# Patient Record
Sex: Female | Born: 1995 | Race: Black or African American | Hispanic: No | Marital: Single | Smoking: Former smoker
Health system: Southern US, Community
[De-identification: ages and names within clinical notes are randomized; demographics above are authoritative.]

## PROBLEM LIST (undated history)

## (undated) DIAGNOSIS — D649 Anemia, unspecified: Secondary | ICD-10-CM

## (undated) DIAGNOSIS — J45909 Unspecified asthma, uncomplicated: Secondary | ICD-10-CM

## (undated) DIAGNOSIS — E119 Type 2 diabetes mellitus without complications: Secondary | ICD-10-CM

## (undated) HISTORY — PX: NO PAST SURGERIES: SHX2092

---
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
## 2017-01-11 ENCOUNTER — Encounter (HOSPITAL_COMMUNITY): Payer: Self-pay

## 2017-01-11 ENCOUNTER — Ambulatory Visit (HOSPITAL_COMMUNITY)
Admission: RE | Admit: 2017-01-11 | Discharge: 2017-01-11 | Disposition: A | Discharge status: Home / Self Care | Payer: Medicaid Other | Source: Ambulatory Visit | Attending: Unknown Physician Specialty | Admitting: Unknown Physician Specialty

## 2017-01-11 DIAGNOSIS — O24313 Unspecified pre-existing diabetes mellitus in pregnancy, third trimester: Secondary | ICD-10-CM

## 2017-01-11 DIAGNOSIS — O24013 Pre-existing diabetes mellitus, type 1, in pregnancy, third trimester: Secondary | ICD-10-CM | POA: Diagnosis not present

## 2017-01-11 DIAGNOSIS — Z3A34 34 weeks gestation of pregnancy: Secondary | ICD-10-CM | POA: Insufficient documentation

## 2017-01-11 DIAGNOSIS — Z362 Encounter for other antenatal screening follow-up: Secondary | ICD-10-CM | POA: Insufficient documentation

## 2017-01-11 NOTE — Addendum Note (Signed)
Encounter addended by: Emeline DarlingKasie E Eldo Umanzor on: 01/11/2017  4:24 PM<BR>    Actions taken: Imaging Exam ended

## 2017-01-27 ENCOUNTER — Encounter (HOSPITAL_COMMUNITY): Payer: Self-pay

## 2017-01-27 ENCOUNTER — Emergency Department (HOSPITAL_COMMUNITY)
Admission: EM | Admit: 2017-01-27 | Discharge: 2017-01-27 | Disposition: A | Discharge status: Home / Self Care | Payer: Medicaid Other | Attending: Emergency Medicine | Admitting: Emergency Medicine

## 2017-01-27 DIAGNOSIS — O26893 Other specified pregnancy related conditions, third trimester: Secondary | ICD-10-CM | POA: Insufficient documentation

## 2017-01-27 DIAGNOSIS — J45909 Unspecified asthma, uncomplicated: Secondary | ICD-10-CM | POA: Diagnosis not present

## 2017-01-27 DIAGNOSIS — E119 Type 2 diabetes mellitus without complications: Secondary | ICD-10-CM | POA: Diagnosis not present

## 2017-01-27 DIAGNOSIS — Z794 Long term (current) use of insulin: Secondary | ICD-10-CM | POA: Insufficient documentation

## 2017-01-27 DIAGNOSIS — Z3490 Encounter for supervision of normal pregnancy, unspecified, unspecified trimester: Secondary | ICD-10-CM

## 2017-01-27 DIAGNOSIS — R1084 Generalized abdominal pain: Secondary | ICD-10-CM | POA: Diagnosis not present

## 2017-01-27 DIAGNOSIS — Z87891 Personal history of nicotine dependence: Secondary | ICD-10-CM | POA: Insufficient documentation

## 2017-01-27 DIAGNOSIS — Z3A37 37 weeks gestation of pregnancy: Secondary | ICD-10-CM | POA: Insufficient documentation

## 2017-01-27 LAB — URINALYSIS, ROUTINE W REFLEX MICROSCOPIC
Bilirubin Urine: NEGATIVE
GLUCOSE, UA: NEGATIVE mg/dL
HGB URINE DIPSTICK: NEGATIVE
Ketones, ur: NEGATIVE mg/dL
LEUKOCYTES UA: NEGATIVE
Nitrite: NEGATIVE
PH: 7 (ref 5.0–8.0)
PROTEIN: NEGATIVE mg/dL
SPECIFIC GRAVITY, URINE: 1.009 (ref 1.005–1.030)

## 2017-01-27 LAB — CBG MONITORING, ED: Glucose-Capillary: 127 mg/dL — ABNORMAL HIGH (ref 65–99)

## 2017-01-27 NOTE — Progress Notes (Signed)
Received faxed FHT tracing, FHR baseline 130 with 15x15 accels, no decelerations. Pt having occasional contractions. Dr Alysia PennaErvin viewed tracing. OB cleared. ED RN informed pt may come off  Fetal monitor.

## 2017-01-27 NOTE — Discharge Instructions (Signed)
Follow-up with your obstetrician in the next 2-3 days, sooner if you experience additional problems.

## 2017-01-27 NOTE — Progress Notes (Signed)
Received call from [facility] for a G3P0 36.[redacted] weeks pregnant who came in complaining of lower abdomen cramping going around to her lower back. Pt states she feels baby moving, and denies any leaking or bleeding. Pt placed on fetal monitor. Tried several times to admit pt to OBIX bed to do remote monitoring, put a call into IT and OBIX to help resolve issue. ED RN informed of OBIX issues and told to run paper and fax me the FHR tracing to show MD.

## 2017-01-27 NOTE — ED Notes (Signed)
Pt talking and laughing, does not appear to be in any distress

## 2017-01-27 NOTE — ED Triage Notes (Addendum)
Reports of lower abdominal cramping that radiates to lower back x2 weeks. States "it feels like something is pushing out of my cooter for 2 weeks now". States she is unable to hold her urine and feels water trickles out. Also reports of swelling that started last night. Was [facility] times for same complaint and was told she was dehydrated. Patient is diabetic.  CBG 127. BP: 141/92

## 2017-01-27 NOTE — ED Provider Notes (Addendum)
[facility]-EMERGENCY DEPT Provider Note   CSN: [id number] Arrival date & time: 01/27/17  1319  By signing my name below, I, Cynda Acres, attest that this documentation has been prepared under the direction and in the presence of Geoffery Lyons, MD. Electronically Signed: Cynda Acres, Scribe. 01/27/17. 1:54 PM.  History   Chief Complaint Chief Complaint  Patient presents with  . Abdominal Cramping    HPI Comments: Brandi Burton is a 21 y.o. female with a hx of DM, who presents to the Emergency Department complaining of sudden-onset, constant lower abdominal cramping that began 2 weeks ago. Patient states it feels as if something is being pushed out of her vagina. Patient has associated radiation to the lower back. Patient describes her pain as pressured. Patient reports [facility], in which they told her that she is and isn't contracting. Patient is [redacted] weeks pregnant. Patient denies any fluid rush or vaginal bleeding.   The history is provided by the patient. No language interpreter was used.    Past Medical History:  Diagnosis Date  . Anemia   . Asthma   . Diabetes mellitus without complication [facility])     Patient Active Problem List   Diagnosis Date Noted  . 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 Supervision of normal first pregnancy 09/05/2015  . New onset type 2 diabetes mellitus (HCC) 04/12/2015  . Overweight 04/12/2015  . Ketonuria 04/12/2015  . Goiter 04/12/2015  . Pallor 04/12/2015  . Essential hypertension, benign 04/12/2015    Past Surgical History:  Procedure Laterality Date  . NO PAST SURGERIES      OB History    Gravida Para Term Preterm AB Living   3       2 0   SAB TAB Ectopic Multiple Live Births   2               Home Medications    Prior to Admission medications   Medication Sig Start Date End Date Taking? Authorizing Provider  ACCU-CHEK FASTCLIX LANCETS MISC Check sugar 3 x daily  04/10/15   David Stall, MD  insulin aspart (NOVOLOG) 100 UNIT/ML injection Inject into the skin 3 (three) times daily before meals.    Historical Provider, MD  insulin detemir (LEVEMIR) 100 UNIT/ML injection Inject 16 Units into the skin at bedtime.    Historical Provider, MD  metFORMIN (GLUCOPHAGE) 500 MG tablet Take 1 tablet (500 mg total) by mouth 2 (two) times daily with a meal. Patient not taking: Reported on 09/04/2015 04/10/15 04/09/16  David Stall, MD  Prenatal Vit w/Fe-Methylfol-FA (PNV PO) Take by mouth.    Historical Provider, MD    Family History No family history on file.  Social History Social History  Substance Use Topics  . Smoking status: Former Smoker    Packs/day: 0.25    Years: 3.00    Types: Cigarettes    Quit date: 12/06/2016  . Smokeless tobacco: Never Used  . Alcohol use No     Allergies   Patient has no known allergies.   Review of Systems Review of Systems  Genitourinary: Positive for vaginal discharge. Negative for vaginal bleeding.  All other systems reviewed and are negative.    Physical Exam Updated Vital Signs BP 141/92 (BP Location: Right Arm)   Pulse 84   Temp 98.4 F (36.9 C) (Oral)   Resp 17   Ht 5\' 3"  (1.6 m)   Wt 180 lb 2 oz (  81.7 kg)   LMP 05/17/2016   SpO2 100%   BMI 31.91 kg/m   Physical Exam  Constitutional: She is oriented to person, place, and time. She appears well-developed.  HENT:  Head: Normocephalic and atraumatic.  Mouth/Throat: Oropharynx is clear and moist.  Eyes: Conjunctivae and EOM are normal. Pupils are equal, round, and reactive to light.  Neck: Normal range of motion. Neck supple.  Cardiovascular: Normal rate and regular rhythm.   Pulmonary/Chest: Effort normal and breath sounds normal.  Abdominal: Soft. Bowel sounds are normal.  Fundal height consisted with stated gestational age.   Musculoskeletal: Normal range of motion.  Neurological: She is alert and oriented to person, place, and time.    Skin: Skin is warm and dry.  Psychiatric: She has a normal mood and affect.     ED Treatments / Results  DIAGNOSTIC STUDIES: Oxygen Saturation is 100% on RA, normal by my interpretation.    COORDINATION OF CARE: 1:53 PM Discussed treatment plan with pt at bedside and pt agreed to plan.  Labs (all labs ordered are listed, but only abnormal results are displayed) Labs Reviewed  CBG MONITORING, ED - Abnormal; Notable for the following:       Result Value   Glucose-Capillary 127 (*)    All other components within normal limits    EKG  EKG Interpretation None       Radiology No results found.  Procedures Procedures (including critical care time)  Medications Ordered in ED Medications - No data to display   Initial Impression / Assessment and Plan / ED Course  I have reviewed the triage vital signs and the nursing notes.  Pertinent labs & imaging results that were available during my care of the patient were reviewed by me and considered in my medical decision making (see chart for details).  Patient presents here with complaints of abdominal distention. She was [redacted] weeks pregnant and believe she may be in labor. [facility] in the past week with similar complaints and each time was told no labor was occurring.  She was monitored for nearly 2 hours. She had some random minor contractions, however no evidence for active labor or fetal distress.  Her blood pressure is normal, there is no edema, and urinalysis is clear with no protein. She will be discharged, to [facility].  Final Clinical Impressions(s) / ED Diagnoses   Final diagnoses:  None    New Prescriptions New Prescriptions   No medications on file   I personally performed the services described in this documentation, which was scribed in my presence. The recorded information has been reviewed and is accurate.        Geoffery Lyonsouglas Tecia Cinnamon, MD 01/27/17  1525    Geoffery Lyonsouglas Mosetta Ferdinand, MD 01/27/17 [phone]

## 2017-01-27 NOTE — ED Notes (Signed)
Per Christine Rapid Response RN at [facility]-tracing is good, baby looks good, no distress. Pt does not appear to be in labor. May d/c monitor.

## 2017-02-08 ENCOUNTER — Ambulatory Visit (HOSPITAL_COMMUNITY): Admission: RE | Admit: 2017-02-08 | Payer: Medicaid Other | Source: Ambulatory Visit

## 2017-04-28 ENCOUNTER — Emergency Department (HOSPITAL_COMMUNITY): Payer: Medicaid Other

## 2017-04-28 ENCOUNTER — Encounter (HOSPITAL_COMMUNITY): Payer: Self-pay | Admitting: *Deleted

## 2017-04-28 ENCOUNTER — Emergency Department (HOSPITAL_COMMUNITY)
Admission: EM | Admit: 2017-04-28 | Discharge: 2017-04-28 | Disposition: A | Discharge status: Home / Self Care | Payer: Medicaid Other | Attending: Emergency Medicine | Admitting: Emergency Medicine

## 2017-04-28 DIAGNOSIS — J45909 Unspecified asthma, uncomplicated: Secondary | ICD-10-CM | POA: Diagnosis not present

## 2017-04-28 DIAGNOSIS — I10 Essential (primary) hypertension: Secondary | ICD-10-CM | POA: Diagnosis not present

## 2017-04-28 DIAGNOSIS — E119 Type 2 diabetes mellitus without complications: Secondary | ICD-10-CM | POA: Insufficient documentation

## 2017-04-28 DIAGNOSIS — I951 Orthostatic hypotension: Secondary | ICD-10-CM | POA: Diagnosis not present

## 2017-04-28 DIAGNOSIS — R55 Syncope and collapse: Secondary | ICD-10-CM

## 2017-04-28 DIAGNOSIS — Z87891 Personal history of nicotine dependence: Secondary | ICD-10-CM | POA: Diagnosis not present

## 2017-04-28 DIAGNOSIS — Z79899 Other long term (current) drug therapy: Secondary | ICD-10-CM | POA: Insufficient documentation

## 2017-04-28 DIAGNOSIS — Z794 Long term (current) use of insulin: Secondary | ICD-10-CM | POA: Insufficient documentation

## 2017-04-28 LAB — I-STAT BETA HCG BLOOD, ED (MC, WL, AP ONLY)

## 2017-04-28 LAB — URINALYSIS, ROUTINE W REFLEX MICROSCOPIC
BILIRUBIN URINE: NEGATIVE
Glucose, UA: NEGATIVE mg/dL
HGB URINE DIPSTICK: NEGATIVE
KETONES UR: 20 mg/dL — AB
Nitrite: NEGATIVE
Protein, ur: 30 mg/dL — AB
SPECIFIC GRAVITY, URINE: 1.029 (ref 1.005–1.030)
pH: 5 (ref 5.0–8.0)

## 2017-04-28 LAB — BASIC METABOLIC PANEL
Anion gap: 8 (ref 5–15)
BUN: 14 mg/dL (ref 6–20)
CHLORIDE: 108 mmol/L (ref 101–111)
CO2: 23 mmol/L (ref 22–32)
CREATININE: 0.76 mg/dL (ref 0.44–1.00)
Calcium: 9.3 mg/dL (ref 8.9–10.3)
GFR calc non Af Amer: >60 mL/min (ref >60)
Glucose, Bld: 106 mg/dL — ABNORMAL HIGH (ref 65–99)
Potassium: 3.9 mmol/L (ref 3.5–5.1)
SODIUM: 139 mmol/L (ref 135–145)

## 2017-04-28 LAB — CBC
HCT: 35 % — ABNORMAL LOW (ref 36.0–46.0)
Hemoglobin: 10.8 g/dL — ABNORMAL LOW (ref 12.0–15.0)
MCH: 24.4 pg — AB (ref 26.0–34.0)
MCHC: 30.9 g/dL (ref 30.0–36.0)
MCV: 79 fL (ref 78.0–100.0)
Platelets: 399 10*3/uL (ref 150–400)
RBC: 4.43 MIL/uL (ref 3.87–5.11)
RDW: 17.1 % — ABNORMAL HIGH (ref 11.5–15.5)
WBC: 5.4 10*3/uL (ref 4.0–10.5)

## 2017-04-28 MED ORDER — SODIUM CHLORIDE 0.9 % IV BOLUS (SEPSIS)
2000.0000 mL | Freq: Once | INTRAVENOUS | Status: AC
  Administered 2017-04-28: 2000 mL via INTRAVENOUS

## 2017-04-28 MED ORDER — ACETAMINOPHEN 500 MG PO TABS
1000.0000 mg | ORAL_TABLET | Freq: Once | ORAL | Status: AC
  Administered 2017-04-28: 1000 mg via ORAL
  Filled 2017-04-28: qty 2

## 2017-04-28 NOTE — ED Triage Notes (Signed)
To ED via [facility] for eval of near syncope while at work. Pt states she woke this am with a HA but no other symptoms. Works in a [employer]. States she was in bathroom at work - doesn't remember having syncopal episode but woke with 15 ppl around her. Pt had baby in March. States she could be pregnant (requests to not have results given in front of her mother). While moving pt from EMS stretcher to triage chair pt became slightly altered, diaphoretic but continued walking with assist. No vomiting. When pt sat in chair she looked up at RN and Medic without knowledge of what just happened. Pupils equal. Denies injury.

## 2017-04-28 NOTE — ED Notes (Signed)
ED Provider at bedside. 

## 2017-04-28 NOTE — ED Provider Notes (Signed)
[facility]-EMERGENCY DEPT Provider Note   CSN: [id number] Arrival date & time: 04/28/17  1309  By signing my name below, I, Thelma Barge, attest that this documentation has been prepared under the direction and in the presence of Melene Plan, DO. Electronically Signed: Thelma Barge, Scribe. 04/28/17. 2:27 PM.  History   Chief Complaint Chief Complaint  Patient presents with  . Near Syncope  . Weakness   The history is provided by the patient. No language interpreter was used.  Near Syncope  This is a new problem. The current episode started 6 to 12 hours ago. Pertinent negatives include no abdominal pain. Nothing aggravates the symptoms. Nothing relieves the symptoms. She has tried nothing for the symptoms.   HPI Comments: Brandi Burton is a 21 y.o. female with a PMHx of anemia who presents to the Emergency Department complaining of a near syncopal episode that occurred earlier today. She has associated HA. She notes she had an episode of diarrhea yesterday with some blood, but this is because she has hemorrhoids. She states when she stands up she has "cotton" in her ears. She denies abdominal pain, vomiting, CP, and SOB.   Past Medical History:  Diagnosis Date  . Anemia   . Asthma   . Diabetes mellitus without complication [facility])     Patient Active Problem List   Diagnosis Date Noted  . 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 Supervision of normal first pregnancy 09/05/2015  . New onset type 2 diabetes mellitus (HCC) 04/12/2015  . Overweight 04/12/2015  . Ketonuria 04/12/2015  . Goiter 04/12/2015  . Pallor 04/12/2015  . Essential hypertension, benign 04/12/2015    Past Surgical History:  Procedure Laterality Date  . NO PAST SURGERIES      OB History    Gravida Para Term Preterm AB Living   3       2 0   SAB TAB Ectopic Multiple Live Births   2               Home Medications    Prior to Admission medications   Medication Sig Start Date  End Date Taking? Authorizing Provider  ACCU-CHEK FASTCLIX LANCETS MISC Check sugar 3 x daily 04/10/15   David Stall, MD  insulin aspart (NOVOLOG) 100 UNIT/ML injection Inject 10 Units into the skin 3 (three) times daily before meals.     [provider]  insulin detemir (LEVEMIR) 100 UNIT/ML injection Inject 16 Units into the skin at bedtime.    [provider]  Prenatal Vit w/Fe-Methylfol-FA (PNV PO) Take 1 tablet by mouth daily.     [provider]    Family History No family history on file.  Social History Social History  Substance Use Topics  . Smoking status: Former Smoker    Packs/day: 0.25    Years: 3.00    Types: Cigarettes    Quit date: 12/06/2016  . Smokeless tobacco: Never Used  . Alcohol use No     Allergies   Patient has no known allergies.   Review of Systems Review of Systems  Constitutional: Negative for chills and fever.  HENT: Negative for congestion and rhinorrhea.   Eyes: Negative for redness and visual disturbance.  Respiratory: Negative for wheezing.   Cardiovascular: Positive for near-syncope. Negative for palpitations.  Gastrointestinal: Positive for blood in stool (due to hemorrhoids) and diarrhea. Negative for abdominal pain and nausea.  Genitourinary: Negative for dysuria and urgency.  Musculoskeletal: Negative for arthralgias and myalgias.  Skin: Negative for pallor and wound.  Neurological: Positive for syncope (near syncope) and light-headedness.     Physical Exam Updated Vital Signs BP (!) 132/92   Pulse (!) 58   Temp 97.7 F (36.5 C) (Oral)   Resp 20   LMP 05/17/2016   SpO2 100%   Physical Exam  Constitutional: She is oriented to person, place, and time. She appears well-developed and well-nourished. No distress.  Clinically orthostatic Decreased sensation to face subjectively   HENT:  Head: Normocephalic and atraumatic.  Eyes: EOM are normal. Pupils are equal, round, and reactive to light.    Neck: Normal range of motion. Neck supple.  Cardiovascular: Normal rate and regular rhythm.  Exam reveals no gallop and no friction rub.   No murmur heard. Pulmonary/Chest: Effort normal. She has no wheezes. She has no rales.  Abdominal: Soft. She exhibits no distension. There is no tenderness.  Musculoskeletal: She exhibits no edema or tenderness.  Neurological: She is alert and oriented to person, place, and time.  Skin: Skin is warm and dry. She is not diaphoretic.  Psychiatric: She has a normal mood and affect. Her behavior is normal.  Nursing note and vitals reviewed.    ED Treatments / Results  DIAGNOSTIC STUDIES: Oxygen Saturation is 100% on RA, normal by my interpretation.    COORDINATION OF CARE: 2:21 PM Discussed treatment plan with pt at bedside and pt agreed to plan.  Labs (all labs ordered are listed, but only abnormal results are displayed) Labs Reviewed  BASIC METABOLIC PANEL - Abnormal; Notable for the following:       Result Value   Glucose, Bld 106 (*)    All other components within normal limits  CBC - Abnormal; Notable for the following:    Hemoglobin 10.8 (*)    HCT 35.0 (*)    MCH 24.4 (*)    RDW 17.1 (*)    All other components within normal limits  URINALYSIS, ROUTINE W REFLEX MICROSCOPIC - Abnormal; Notable for the following:    Ketones, ur 20 (*)    Protein, ur 30 (*)    Leukocytes, UA TRACE (*)    Bacteria, UA RARE (*)    Squamous Epithelial / LPF 0-5 (*)    All other components within normal limits  I-STAT BETA HCG BLOOD, ED ([facility], [facility], [facility] ONLY)    EKG  EKG Interpretation  Date/Time:  Thursday Apr 28 2017 13:26:37 EDT Ventricular Rate:  67 PR Interval:  154 QRS Duration: 80 QT Interval:  400 QTC Calculation: 422 R Axis:   93 Text Interpretation:  Normal sinus rhythm with sinus arrhythmia Rightward axis Borderline ECG no wpw, prolonged qt or brugada No old tracing to compare Confirmed by Calvert Charland MD, DANIEL [phone]) on 04/28/2017 1:47:41 PM        Radiology Ct Head Wo Contrast  Result Date: 04/28/2017 CLINICAL DATA:  Patient had three syncopal episodes earlier today.Feels as if ears are stopped up. No injury to head. EXAM: CT HEAD WITHOUT CONTRAST TECHNIQUE: Contiguous axial images were obtained from the base of the skull through the vertex without intravenous contrast. COMPARISON:  None. FINDINGS: Brain: No evidence of acute infarction, hemorrhage, hydrocephalus, extra-axial collection or mass lesion/mass effect. Vascular: No hyperdense vessel or unexpected calcification. Skull: Normal. Negative for fracture or focal lesion. Sinuses/Orbits: No acute finding. Other: None. IMPRESSION: Normal exam. Electronically Signed   By: Norva Pavlov M.D.   On: 04/28/2017 15:52    Procedures Procedures (including critical care time)  Medications Ordered in ED Medications  sodium chloride 0.9 % bolus 2,000 mL (0 mLs Intravenous Stopped 04/28/17 1614)  acetaminophen (TYLENOL) tablet 1,000 mg (1,000 mg Oral Given 04/28/17 1439)     Initial Impression / Assessment and Plan / ED Course  I have reviewed the triage vital signs and the nursing notes.  Pertinent labs & imaging results that were available during my care of the patient were reviewed by me and considered in my medical decision making (see chart for details).     21 yo F With a chief complaint of a syncopal event. Patient states that it was very warm where she works and she went to the bathroom and when she stood up from the toilet she felt really lightheaded. She walked in the hallway and then woke up on the ground with people around her. She had one episode where she felt like she needed to vomit but resolved. Currently feels back to baseline. On my exam the patient was clinically orthostatic and felt very lightheaded upon standing. She has been complaining of a severe headache. I suspect the patient likely has a heat injury. Will give 2 L of fluids and reassess. With patient with a  new severe headache will obtain a CT scan. Labs reassuring.  CT negative. Patient feeling much better after IV fluids. Tolerating by mouth in the ED. Was able to ambulate now without difficulty. Requesting discharge.  4:15 PM:  I have discussed the diagnosis/risks/treatment options with the patient and family and believe the pt to be eligible for discharge home to follow-up with PCP. We also discussed returning to the ED immediately if new or worsening sx occur. We discussed the sx which are most concerning (e.g., sudden worsening pain, fever, inability to tolerate by mouth) that necessitate immediate return. Medications administered to the patient during their visit and any new prescriptions provided to the patient are listed below.  Medications given during this visit Medications  sodium chloride 0.9 % bolus 2,000 mL (0 mLs Intravenous Stopped 04/28/17 1614)  acetaminophen (TYLENOL) tablet 1,000 mg (1,000 mg Oral Given 04/28/17 1439)     The patient appears reasonably screen and/or stabilized for discharge and I doubt any other medical condition or other [facility] requiring further screening, evaluation, or treatment in the ED at this time prior to discharge.    Final Clinical Impressions(s) / ED Diagnoses   Final diagnoses:  Syncope and collapse  Orthostatic hypotension    New Prescriptions New Prescriptions   No medications on file  I personally performed the services described in this documentation, which was scribed in my presence. The recorded information has been reviewed and is accurate.      Melene PlanFloyd, Skyra Crichlow, DO 04/28/17 1615

## 2017-04-28 NOTE — ED Notes (Signed)
Pt ambulatory in treatment room to the door, pt became dizzy and diaphoretic, Dr Adela LankFloyd present in the room.

## 2017-04-28 NOTE — ED Notes (Signed)
Patient transported to CT 

## 2017-04-28 NOTE — ED Notes (Signed)
Pt wheeled to the restroom with wheelchair to obtain sample

## 2017-04-28 NOTE — ED Notes (Signed)
Pt returned from CT °

## 2017-04-28 NOTE — ED Notes (Signed)
Pt states she feels much better after her 2 L of fluid. Pt states "I went to the bathroom and was walking around just fine!"

## 2017-04-28 NOTE — ED Notes (Signed)
Pt unable to void at this time. 

## 2017-10-10 ENCOUNTER — Encounter (HOSPITAL_COMMUNITY): Payer: Self-pay

## 2018-03-07 ENCOUNTER — Encounter (HOSPITAL_COMMUNITY): Payer: Self-pay | Admitting: Emergency Medicine

## 2018-03-07 ENCOUNTER — Emergency Department (HOSPITAL_COMMUNITY)
Admission: EM | Admit: 2018-03-07 | Discharge: 2018-03-07 | Disposition: A | Discharge status: Home / Self Care | Payer: Self-pay | Attending: Emergency Medicine | Admitting: Emergency Medicine

## 2018-03-07 ENCOUNTER — Other Ambulatory Visit: Payer: Self-pay

## 2018-03-07 DIAGNOSIS — I10 Essential (primary) hypertension: Secondary | ICD-10-CM | POA: Insufficient documentation

## 2018-03-07 DIAGNOSIS — Z794 Long term (current) use of insulin: Secondary | ICD-10-CM | POA: Insufficient documentation

## 2018-03-07 DIAGNOSIS — Z79899 Other long term (current) drug therapy: Secondary | ICD-10-CM | POA: Insufficient documentation

## 2018-03-07 DIAGNOSIS — Z87891 Personal history of nicotine dependence: Secondary | ICD-10-CM | POA: Insufficient documentation

## 2018-03-07 DIAGNOSIS — R200 Anesthesia of skin: Secondary | ICD-10-CM | POA: Insufficient documentation

## 2018-03-07 DIAGNOSIS — R51 Headache: Secondary | ICD-10-CM | POA: Insufficient documentation

## 2018-03-07 DIAGNOSIS — H539 Unspecified visual disturbance: Secondary | ICD-10-CM | POA: Insufficient documentation

## 2018-03-07 DIAGNOSIS — J45909 Unspecified asthma, uncomplicated: Secondary | ICD-10-CM | POA: Insufficient documentation

## 2018-03-07 DIAGNOSIS — E119 Type 2 diabetes mellitus without complications: Secondary | ICD-10-CM | POA: Insufficient documentation

## 2018-03-07 LAB — CBG MONITORING, ED: Glucose-Capillary: 128 mg/dL — ABNORMAL HIGH (ref 65–99)

## 2018-03-07 MED ORDER — ACETAMINOPHEN ER 650 MG PO TBCR: 650.0000 mg | EXTENDED_RELEASE_TABLET | Freq: Three times a day (TID) | ORAL | 0 refills | Status: AC

## 2018-03-07 NOTE — Discharge Instructions (Addendum)
Please see the neurologist for further evaluation of your intermittent symptoms.  Please return to the ER if you get sudden, severe headaches or start developing new one sided numbness, tingling, weakness or confusion, seizures, poor balance or poor vision.

## 2018-03-07 NOTE — ED Triage Notes (Signed)
Pt states she had a panic attack 3 months ago, since then her head goes numb every day and has headaches every day.

## 2018-03-07 NOTE — ED Provider Notes (Signed)
[facility] EMERGENCY DEPARTMENT Provider Note   CSN: [id number] Arrival date & time: 03/07/18  1828     History   Chief Complaint No chief complaint on file.   HPI Brandi Burton is a 22 y.o. female.  HPI  22 year old with history of diabetes comes in with chief complaint of numbness of her head.  For the past 3 months patient has been having numbness intermittently to her head diffusely.  When patient's symptoms last more than 15 minutes she also gets bilateral blurry vision.  Her symptoms resolved with head massage.  Patient symptoms started after she had a panic attack.  Since then she gets daily 1 or 2 episodes that are as described above.  Patient denies any head trauma, there is no family history of brain aneurysm/brain bleeding/MS/brain tumors.  Patient does not have a week of headaches and in general she denies any severe headaches.  Review of system is negative for any medical condition besides diabetes.  Patient occasionally smokes but denies any heavy alcohol use or substance abuse.  Patient also had a singular syncopal episode 2 months ago.   Past Medical History:  Diagnosis Date  . Anemia   . Asthma   . Diabetes mellitus without complication [facility])     Patient Active Problem List   Diagnosis Date Noted  . 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 Supervision of normal first pregnancy 09/05/2015  . New onset type 2 diabetes mellitus (HCC) 04/12/2015  . Overweight 04/12/2015  . Ketonuria 04/12/2015  . Goiter 04/12/2015  . Pallor 04/12/2015  . Essential hypertension, benign 04/12/2015    Past Surgical History:  Procedure Laterality Date  . NO PAST SURGERIES       OB History    Gravida  3   Para      Term      Preterm      AB  2   Living  0     SAB  2   TAB      Ectopic      Multiple      Live Births               Home Medications    Prior to Admission medications   Medication Sig Start Date End Date  Taking? Authorizing Provider  ACCU-CHEK FASTCLIX LANCETS MISC Check sugar 3 x daily 04/10/15   David Stall, MD  acetaminophen (TYLENOL 8 HOUR) 650 MG CR tablet Take 1 tablet (650 mg total) by mouth every 8 (eight) hours. 03/07/18   Derwood Kaplan, MD  insulin aspart (NOVOLOG) 100 UNIT/ML injection Inject 10 Units into the skin 3 (three) times daily before meals.     [provider]  insulin detemir (LEVEMIR) 100 UNIT/ML injection Inject 16 Units into the skin at bedtime.    [provider]  Prenatal Vit w/Fe-Methylfol-FA (PNV PO) Take 1 tablet by mouth daily.     [provider]    Family History No family history on file.  Social History Social History   Tobacco Use  . Smoking status: Former Smoker    Packs/day: 0.25    Years: 3.00    Pack years: 0.75    Types: Cigarettes    Last attempt to quit: 12/06/2016    Years since quitting: 1.2  . Smokeless tobacco: Never Used  Substance Use Topics  . Alcohol use: No    Alcohol/week: 0.0 oz  . Drug use: Yes    Types: Marijuana  Comment: 2 blunts a day, stopped 12-06-16     Allergies   Patient has no known allergies.   Review of Systems Review of Systems  Constitutional: Negative for activity change.  Eyes: Positive for visual disturbance.  Skin: Negative for rash.  Allergic/Immunologic: Negative for immunocompromised state.  Neurological: Positive for numbness and headaches. Negative for dizziness, seizures and facial asymmetry.  Hematological: Does not bruise/bleed easily.     Physical Exam Updated Vital Signs BP 128/83   Pulse 85   Temp 98.2 F (36.8 C) (Oral)   Resp 20   Ht 5\' 3"  (1.6 m)   Wt 63 kg (139 lb)   SpO2 100%   BMI 24.62 kg/m   Physical Exam  Constitutional: She is oriented to person, place, and time. She appears well-developed.  HENT:  Head: Normocephalic and atraumatic.  Eyes: EOM are normal.  Neck: Normal range of motion. Neck supple.  No bruits  Cardiovascular:  Normal rate.  Pulmonary/Chest: Effort normal.  Abdominal: Bowel sounds are normal.  Neurological: She is alert and oriented to person, place, and time.  Cerebellar exam is normal (finger to nose) Sensory exam normal for bilateral upper and lower extremities - and patient is able to discriminate between sharp and dull. Motor exam is 4+/5   Skin: Skin is warm and dry.  Nursing note and vitals reviewed.    ED Treatments / Results  Labs (all labs ordered are listed, but only abnormal results are displayed) Labs Reviewed  CBG MONITORING, ED - Abnormal; Notable for the following components:      Result Value   Glucose-Capillary 128 (*)    All other components within normal limits    EKG None  Radiology No results found.  Procedures Procedures (including critical care time)  Medications Ordered in ED Medications - No data to display   Initial Impression / Assessment and Plan / ED Course  I have reviewed the triage vital signs and the nursing notes.  Pertinent labs & imaging results that were available during my care of the patient were reviewed by me and considered in my medical decision making (see chart for details).     22 year old female comes in with chief complaint of numbness.  Patient is also complaining of intermittent headaches associated with the numbness and some vision disturbance.  Patient is diabetic.  Her symptoms started 3 months ago after a panic attack.  Episodes are intermittent and without any specific evoking factors.  Symptoms last for about 15 minutes.  I am unsure as to what the underlying etiologies.  Patient does not have any nuchal rigidity, neck bruits and her neuro exam is nonfocal.  We did consider emergent pathologies like brain tumor, pseudotumor cerebri, brain aneurysms in our differential diagnosis, however with bilateral vision changes and generalized numbness over the head with headaches -there is no clear pathway towards a diagnosis.  Since  her symptoms have been going on for 3 months I will defer further nonemergent workup to the neurologist.  Final Clinical Impressions(s) / ED Diagnoses   Final diagnoses:  Numbness  Visual disturbance    ED Discharge Orders        Ordered    acetaminophen (TYLENOL 8 HOUR) 650 MG CR tablet  Every 8 hours     03/07/18 1937       Derwood KaplanNanavati, Jin Capote, MD 03/07/18 1951

## 2018-04-21 ENCOUNTER — Encounter: Payer: Self-pay | Admitting: "Endocrinology

## 2018-06-01 ENCOUNTER — Ambulatory Visit: Payer: Self-pay | Admitting: "Endocrinology

## 2018-12-03 ENCOUNTER — Other Ambulatory Visit: Payer: Self-pay

## 2018-12-03 ENCOUNTER — Encounter (HOSPITAL_COMMUNITY): Payer: Self-pay | Admitting: Emergency Medicine

## 2018-12-03 ENCOUNTER — Emergency Department (HOSPITAL_COMMUNITY): Payer: Medicaid Other

## 2018-12-03 ENCOUNTER — Emergency Department (HOSPITAL_COMMUNITY)
Admission: EM | Admit: 2018-12-03 | Discharge: 2018-12-03 | Disposition: A | Discharge status: Home / Self Care | Payer: Medicaid Other | Attending: Emergency Medicine | Admitting: Emergency Medicine

## 2018-12-03 DIAGNOSIS — K5900 Constipation, unspecified: Secondary | ICD-10-CM

## 2018-12-03 DIAGNOSIS — Z794 Long term (current) use of insulin: Secondary | ICD-10-CM | POA: Insufficient documentation

## 2018-12-03 DIAGNOSIS — K2900 Acute gastritis without bleeding: Secondary | ICD-10-CM

## 2018-12-03 DIAGNOSIS — E119 Type 2 diabetes mellitus without complications: Secondary | ICD-10-CM | POA: Insufficient documentation

## 2018-12-03 DIAGNOSIS — Z87891 Personal history of nicotine dependence: Secondary | ICD-10-CM | POA: Insufficient documentation

## 2018-12-03 DIAGNOSIS — J45909 Unspecified asthma, uncomplicated: Secondary | ICD-10-CM | POA: Insufficient documentation

## 2018-12-03 DIAGNOSIS — I10 Essential (primary) hypertension: Secondary | ICD-10-CM | POA: Insufficient documentation

## 2018-12-03 DIAGNOSIS — R1011 Right upper quadrant pain: Secondary | ICD-10-CM

## 2018-12-03 LAB — CBC
HCT: 38.8 % (ref 36.0–46.0)
Hemoglobin: 12.4 g/dL (ref 12.0–15.0)
MCH: 28.8 pg (ref 26.0–34.0)
MCHC: 32 g/dL (ref 30.0–36.0)
MCV: 90 fL (ref 80.0–100.0)
PLATELETS: 398 10*3/uL (ref 150–400)
RBC: 4.31 MIL/uL (ref 3.87–5.11)
RDW: 11.7 % (ref 11.5–15.5)
WBC: 7.6 10*3/uL (ref 4.0–10.5)
nRBC: 0 % (ref 0.0–0.2)

## 2018-12-03 LAB — URINALYSIS, ROUTINE W REFLEX MICROSCOPIC
Bacteria, UA: NONE SEEN
Bilirubin Urine: NEGATIVE
Glucose, UA: >=500 mg/dL — AB
Hgb urine dipstick: NEGATIVE
KETONES UR: NEGATIVE mg/dL
Nitrite: NEGATIVE
PH: 6 (ref 5.0–8.0)
PROTEIN: NEGATIVE mg/dL
Specific Gravity, Urine: 1.022 (ref 1.005–1.030)

## 2018-12-03 LAB — COMPREHENSIVE METABOLIC PANEL
ALBUMIN: 4.3 g/dL (ref 3.5–5.0)
ALK PHOS: 71 U/L (ref 38–126)
ALT: 10 U/L (ref 0–44)
AST: 14 U/L — AB (ref 15–41)
Anion gap: 9 (ref 5–15)
BUN: 15 mg/dL (ref 6–20)
CALCIUM: 9.3 mg/dL (ref 8.9–10.3)
CO2: 26 mmol/L (ref 22–32)
CREATININE: 0.68 mg/dL (ref 0.44–1.00)
Chloride: 100 mmol/L (ref 98–111)
GFR calc Af Amer: >60 mL/min (ref >60)
GFR calc non Af Amer: >60 mL/min (ref >60)
GLUCOSE: 159 mg/dL — AB (ref 70–99)
Potassium: 4 mmol/L (ref 3.5–5.1)
Sodium: 135 mmol/L (ref 135–145)
Total Bilirubin: 0.5 mg/dL (ref 0.3–1.2)
Total Protein: 8.2 g/dL — ABNORMAL HIGH (ref 6.5–8.1)

## 2018-12-03 LAB — LIPASE, BLOOD: Lipase: 26 U/L (ref 11–51)

## 2018-12-03 LAB — HCG, QUANTITATIVE, PREGNANCY: hCG, Beta Chain, Quant, S: <1 m[IU]/mL (ref <5)

## 2018-12-03 MED ORDER — RANITIDINE HCL 150 MG PO TABS: 150.0000 mg | ORAL_TABLET | Freq: Two times a day (BID) | ORAL | 0 refills | Status: AC

## 2018-12-03 MED ORDER — POLYETHYLENE GLYCOL 3350 17 G PO PACK: 17.0000 g | PACK | Freq: Every day | ORAL | 0 refills | Status: AC

## 2018-12-03 MED ORDER — SODIUM CHLORIDE 0.9 % IV BOLUS
1000.0000 mL | Freq: Once | INTRAVENOUS | Status: AC
  Administered 2018-12-03: 1000 mL via INTRAVENOUS

## 2018-12-03 MED ORDER — IOPAMIDOL (ISOVUE-300) INJECTION 61%
100.0000 mL | Freq: Once | INTRAVENOUS | Status: AC | PRN
  Administered 2018-12-03: 100 mL via INTRAVENOUS

## 2018-12-03 NOTE — ED Provider Notes (Signed)
[facility] EMERGENCY DEPARTMENT Provider Note   CSN: [id number] Arrival date & time: 12/03/18  1830     History   Chief Complaint Chief Complaint  Patient presents with  . Abdominal Pain    HPI Brandi Burton is a 22 y.o. female.  She is complaining of some right flank and right upper quadrant pain that is been going on for about a week.  She said it has been happening every day.  She also feels a little bit of suprapubic pain.  She is never had this before.  No fevers or chills no nausea no vomiting.  She says the pain gets a little worse when she urinates.  No dysuria.  No surgical history.   Abdominal Pain   This is a new problem. The current episode started more than 1 week ago. The problem occurs constantly. The problem has not changed since onset.The pain is associated with an unknown factor. The pain is located in the RUQ. The quality of the pain is aching. The pain is moderate. Pertinent negatives include fever, diarrhea, nausea, vomiting, constipation, dysuria, frequency, hematuria and headaches. The symptoms are aggravated by urination. Nothing relieves the symptoms.    Past Medical History:  Diagnosis Date  . Anemia   . Asthma   . Diabetes mellitus without complication [facility])     Patient Active Problem List   Diagnosis Date Noted  . 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 Supervision of normal first pregnancy 09/05/2015  . New onset type 2 diabetes mellitus (HCC) 04/12/2015  . Overweight 04/12/2015  . Ketonuria 04/12/2015  . Goiter 04/12/2015  . Pallor 04/12/2015  . Essential hypertension, benign 04/12/2015    Past Surgical History:  Procedure Laterality Date  . NO PAST SURGERIES       OB History    Gravida  3   Para      Term      Preterm      AB  2   Living  0     SAB  2   TAB      Ectopic      Multiple      Live Births               Home Medications    Prior to Admission medications     Medication Sig Start Date End Date Taking? Authorizing Provider  ACCU-CHEK FASTCLIX LANCETS MISC Check sugar 3 x daily 04/10/15   David Stall, MD  acetaminophen (TYLENOL 8 HOUR) 650 MG CR tablet Take 1 tablet (650 mg total) by mouth every 8 (eight) hours. 03/07/18   Derwood Kaplan, MD  insulin aspart (NOVOLOG) 100 UNIT/ML injection Inject 10 Units into the skin 3 (three) times daily before meals.     [provider]  insulin detemir (LEVEMIR) 100 UNIT/ML injection Inject 16 Units into the skin at bedtime.    [provider]  Prenatal Vit w/Fe-Methylfol-FA (PNV PO) Take 1 tablet by mouth daily.     [provider]    Family History No family history on file.  Social History Social History   Tobacco Use  . Smoking status: Former Smoker    Packs/day: 0.25    Years: 3.00    Pack years: 0.75    Types: Cigarettes    Last attempt to quit: 12/06/2016    Years since quitting: 1.9  . Smokeless tobacco: Never Used  Substance Use Topics  . Alcohol use: No  Alcohol/week: 0.0 standard drinks  . Drug use: Yes    Types: Marijuana    Comment: 2 blunts a day, stopped 12-06-16     Allergies   Patient has no known allergies.   Review of Systems Review of Systems  Constitutional: Negative for fever.  HENT: Negative for sore throat.   Eyes: Negative for visual disturbance.  Respiratory: Negative for shortness of breath.   Cardiovascular: Negative for chest pain.  Gastrointestinal: Positive for abdominal pain. Negative for constipation, diarrhea, nausea and vomiting.  Genitourinary: Negative for dysuria, frequency and hematuria.  Musculoskeletal: Positive for back pain. Negative for neck pain.  Skin: Negative for rash.  Neurological: Negative for headaches.     Physical Exam Updated Vital Signs BP 126/82 (BP Location: Right Arm)   Pulse 85   Temp 98.4 F (36.9 C) (Oral)   Resp 16   Ht 5\' 3"  (1.6 m)   Wt 74.4 kg   LMP 11/29/2018   SpO2 100%   BMI  29.05 kg/m   Physical Exam Vitals signs and nursing note reviewed.  Constitutional:      General: She is not in acute distress.    Appearance: She is well-developed.  HENT:     Head: Normocephalic and atraumatic.  Eyes:     Conjunctiva/sclera: Conjunctivae normal.  Neck:     Musculoskeletal: Neck supple.  Cardiovascular:     Rate and Rhythm: Normal rate and regular rhythm.     Heart sounds: No murmur.  Pulmonary:     Effort: Pulmonary effort is normal. No respiratory distress.     Breath sounds: Normal breath sounds.  Abdominal:     Palpations: Abdomen is soft.     Tenderness: There is abdominal tenderness in the right upper quadrant. There is right CVA tenderness. There is no guarding or rebound.  Musculoskeletal: Normal range of motion.        General: No tenderness or signs of injury.  Skin:    General: Skin is warm and dry.     Capillary Refill: Capillary refill takes less than 2 seconds.  Neurological:     General: No focal deficit present.     Mental Status: She is alert and oriented to person, place, and time.      ED Treatments / Results  Labs (all labs ordered are listed, but only abnormal results are displayed) Labs Reviewed  COMPREHENSIVE METABOLIC PANEL - Abnormal; Notable for the following components:      Result Value   Glucose, Bld 159 (*)    Total Protein 8.2 (*)    AST 14 (*)    All other components within normal limits  URINALYSIS, ROUTINE W REFLEX MICROSCOPIC - Abnormal; Notable for the following components:   Glucose, UA >=500 (*)    Leukocytes, UA SMALL (*)    All other components within normal limits  LIPASE, BLOOD  CBC  HCG, QUANTITATIVE, PREGNANCY    EKG None  Radiology Ct Abdomen Pelvis W Contrast  Result Date: 12/03/2018 CLINICAL DATA:  Right upper quadrant pain radiating to the anterior and left upper quadrant of the abdomen. Nausea without vomiting. EXAM: CT ABDOMEN AND PELVIS WITH CONTRAST TECHNIQUE: Multidetector CT imaging of  the abdomen and pelvis was performed using the standard protocol following bolus administration of intravenous contrast. CONTRAST:  100mL ISOVUE-300 IOPAMIDOL (ISOVUE-300) INJECTION 61% COMPARISON:  None. FINDINGS: Lower chest: No acute abnormality. Hepatobiliary: No focal liver abnormality is seen. No gallstones, gallbladder wall thickening, or biliary dilatation. Pancreas: Unremarkable. No pancreatic  ductal dilatation or surrounding inflammatory changes. Spleen: Normal in size without focal abnormality. Adrenals/Urinary Tract: Adrenal glands are unremarkable. Kidneys are normal, without renal calculi, focal lesion, or hydronephrosis. Bladder is unremarkable. Stomach/Bowel: The stomach is decompressed in appearance accounting for its somewhat thickened appearance. Gastritis is believed less likely but not entirely excluded. Normal duodenal sweep and of Treitz. Moderate stool retention within the colon without acute obstruction or inflammation. Normal appendix. Vascular/Lymphatic: No significant vascular findings are present. No enlarged abdominal or pelvic lymph nodes. Reproductive: Uterus and bilateral adnexa are unremarkable. Other: No abdominal wall hernia or abnormality. No abdominopelvic ascites. Musculoskeletal: No acute or significant osseous findings. IMPRESSION: 1. Decompressed appearance of the stomach may account the somewhat thickened appearance. The possibility of gastritis is not entirely excluded given history of left upper quadrant pain. 2. Moderate stool retention within the colon. Electronically Signed   By: Tollie Ethavid  Kwon M.D.   On: 12/03/2018 23:15    Procedures Procedures (including critical care time)  Medications Ordered in ED Medications  sodium chloride 0.9 % bolus 1,000 mL (has no administration in time range)     Initial Impression / Assessment and Plan / ED Course  I have reviewed the triage vital signs and the nursing notes.  Pertinent labs & imaging results that were  available during my care of the patient were reviewed by me and considered in my medical decision making (see chart for details).  Clinical Course as of Dec 05 1323  Sun Dec 03, 2018  2320 I updated the patient on the results of her test.  She said she has not pooped in a couple weeks because she had a pack cheese.  I will start her on a laxative and also start her on some acid medication in case the gastritis is the cause of her symptoms.   [MB]    Clinical Course User Index [MB] Terrilee FilesButler, Yanelle Sousa C, MD     Final Clinical Impressions(s) / ED Diagnoses   Final diagnoses:  Right upper quadrant abdominal pain  Constipation, unspecified constipation type  Acute gastritis without hemorrhage, unspecified gastritis type    ED Discharge Orders         Ordered    polyethylene glycol (MIRALAX / GLYCOLAX) packet  Daily     12/03/18 2308    ranitidine (ZANTAC) 150 MG tablet  2 times daily     12/03/18 2321           Terrilee FilesButler, Genoa Freyre C, MD 12/04/18 1325

## 2018-12-03 NOTE — ED Notes (Signed)
Patient transported to CT 

## 2018-12-03 NOTE — Discharge Instructions (Addendum)
You were seen in the emergency department for 1 week of right upper quadrant pain.  Your lab work and urinalysis were unremarkable.  Your CAT scan did not show an obvious cause of your pain other than you look like you have a lot of constipation.  You should drink plenty of fluids and we are prescribing you some medication as a laxative.  Please return if any worsening symptoms.

## 2018-12-03 NOTE — ED Triage Notes (Signed)
PT STATES THAT SHE HAS BEEN HAVING RIGHT SIDED ABD PAIN THAT NOW GOES ALL THE WAY ACROSS HER ABD. SHE DENIES N/V/D

## 2019-01-28 IMAGING — CT CT HEAD W/O CM
4 series · 16 of 47 positions shown, 18 images · non-contrast
Comparison: None.

CLINICAL DATA: Patient had three syncopal episodes earlier
today.Feels as if ears are stopped up. No injury to head.

EXAM:
CT HEAD WITHOUT CONTRAST
TECHNIQUE: Contiguous axial images were obtained from the base of the skull
through the vertex without intravenous contrast.

[Series 3: head without · axial · non-contrast · 0.44mm/px · z∈[+1058,+1183]mm · 7 of 35 slices shown, 9 images]
[im 5/35  brain]
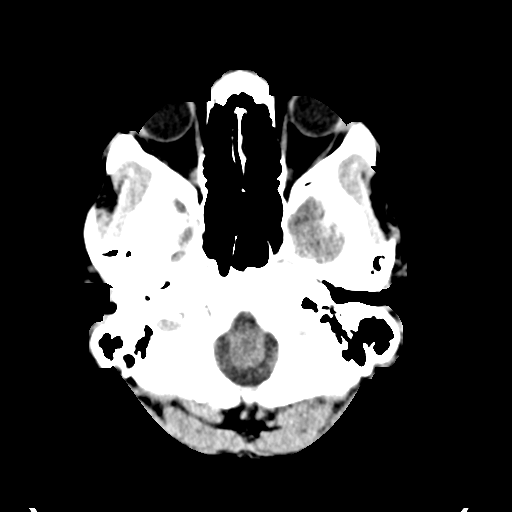
[im 5/35  bone]
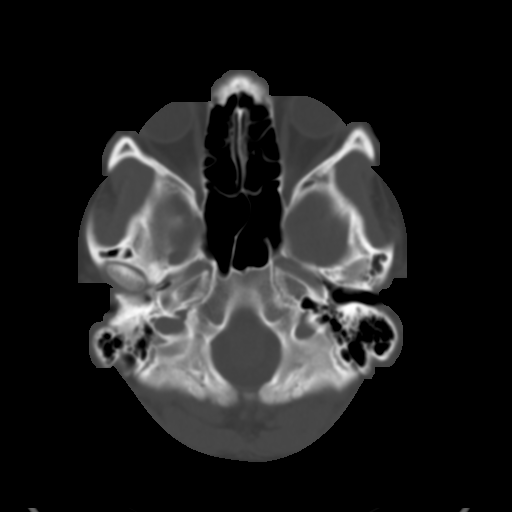
[im 9/35  brain]
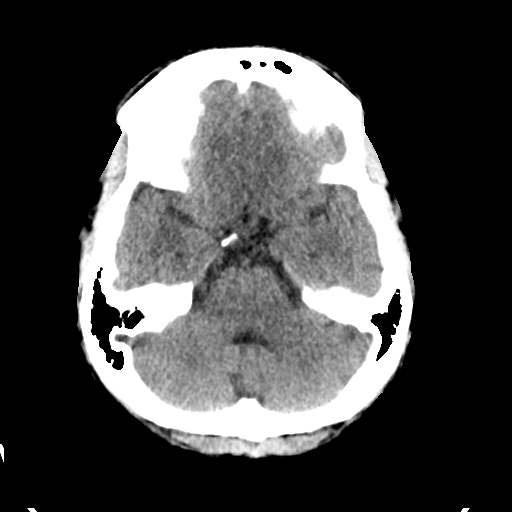
[im 13/35  brain]
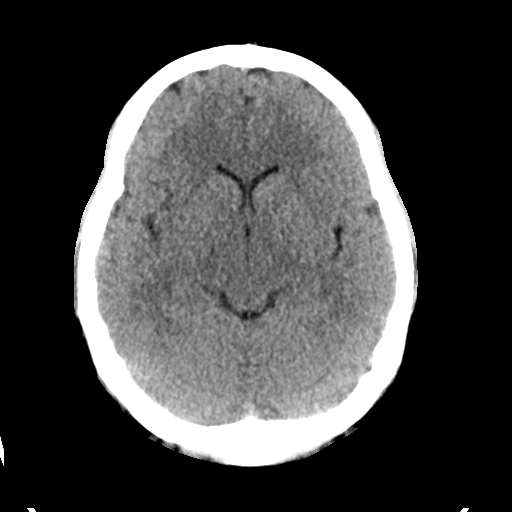
[im 18/35  brain]
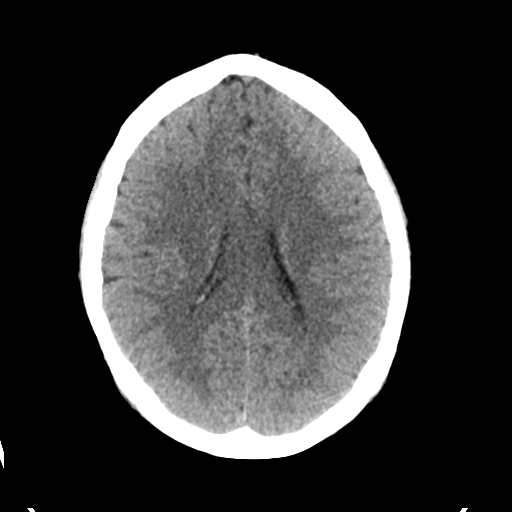
[im 22/35  brain]
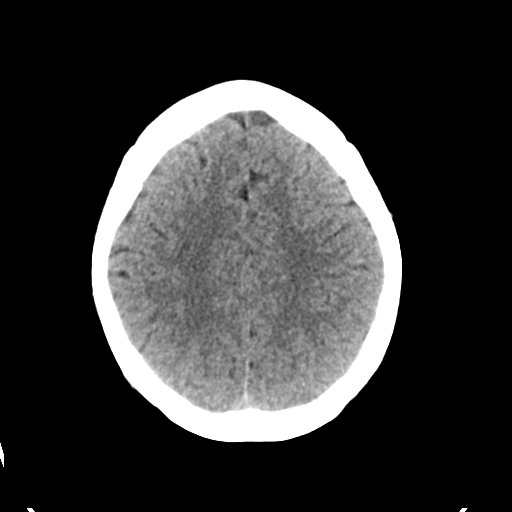
[im 22/35  bone]
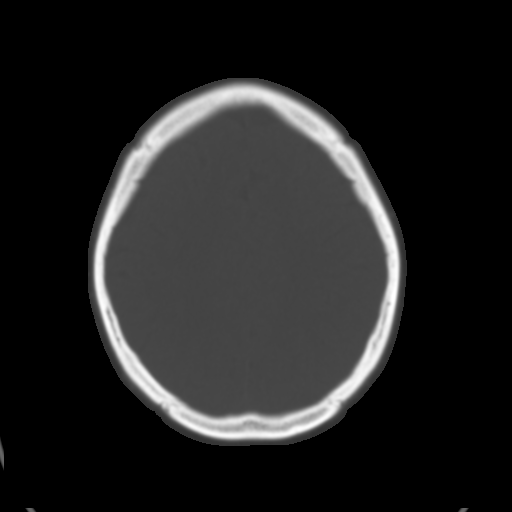
[im 26/35  brain]
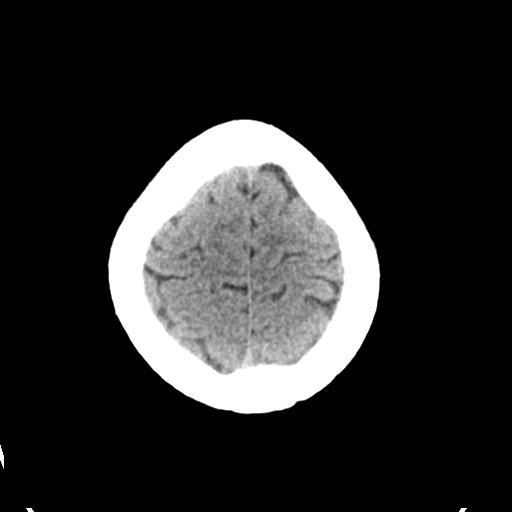
[im 30/35  brain]
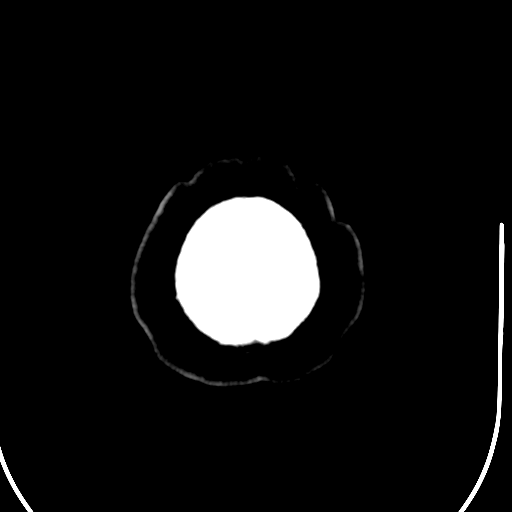

[Series 4: head bone · axial · 0.44mm/px · z∈[+1054,+1088]mm · 3 of 86 slices shown]
[im 9/86  bone]
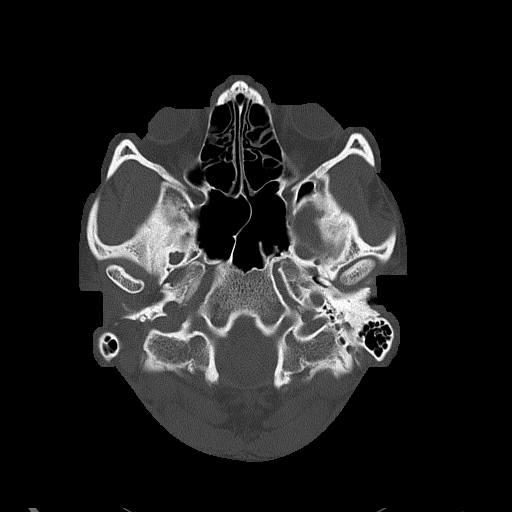
[im 18/86  bone]
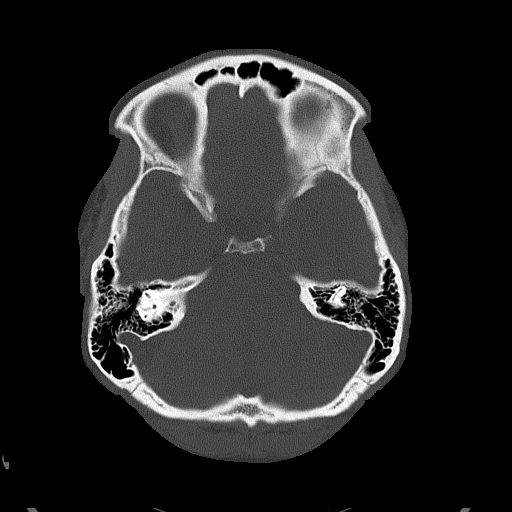
[im 26/86  bone]
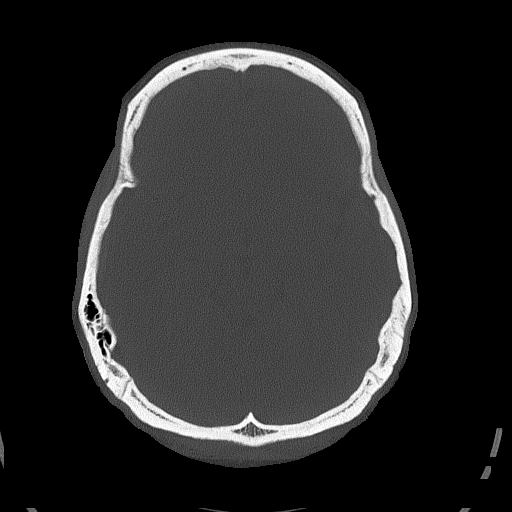

[Series 5: head without cor · coronal · non-contrast · 0.33mm/px · 3 of 72 slices shown]
[im 24/72  brain]
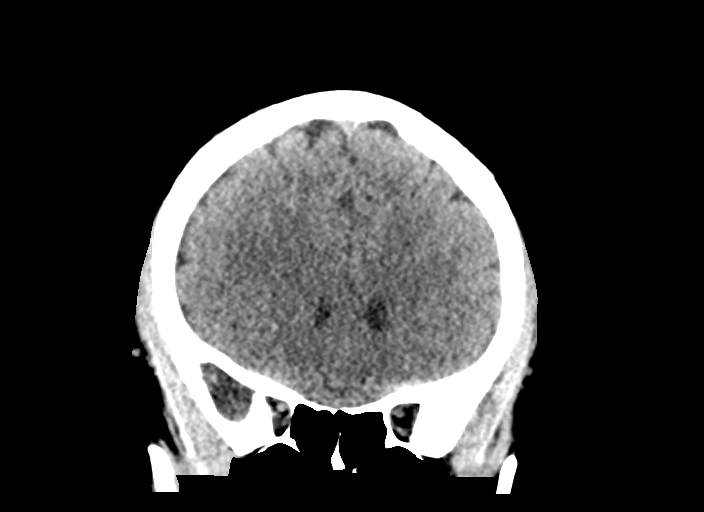
[im 32/72  brain]
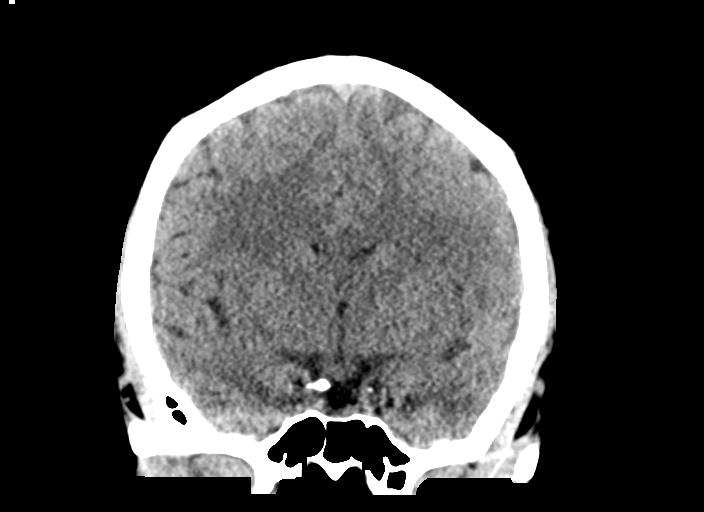
[im 40/72  brain]
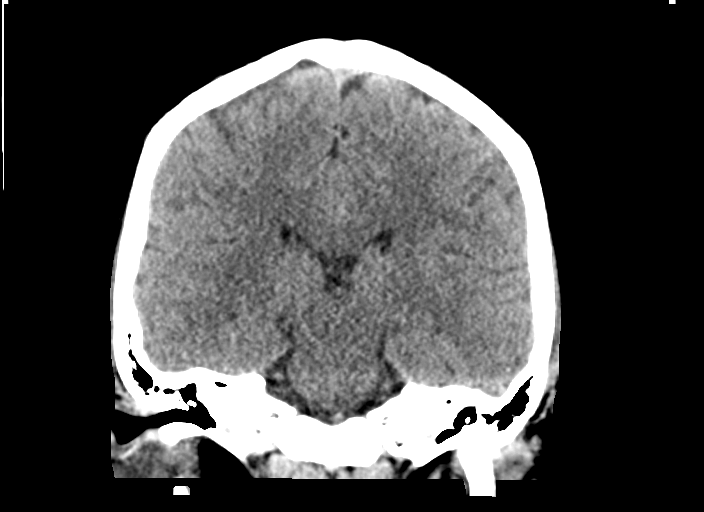

[Series 6: head without sag · sagittal · non-contrast · 0.33mm/px · 3 of 67 slices shown]
[im 23/67  brain]
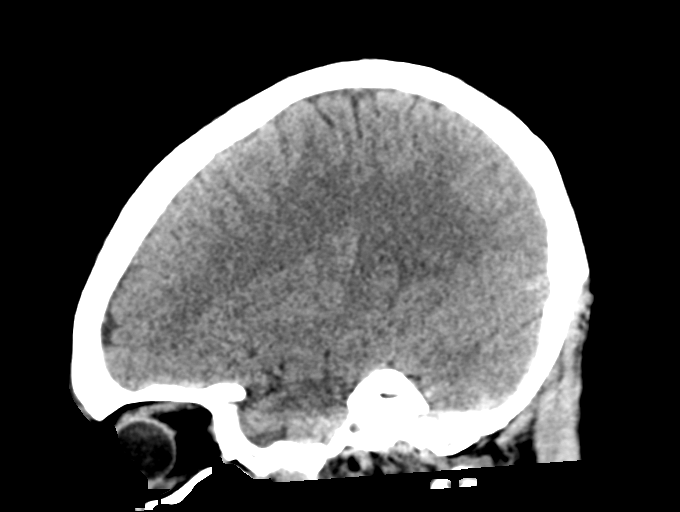
[im 34/67  brain]
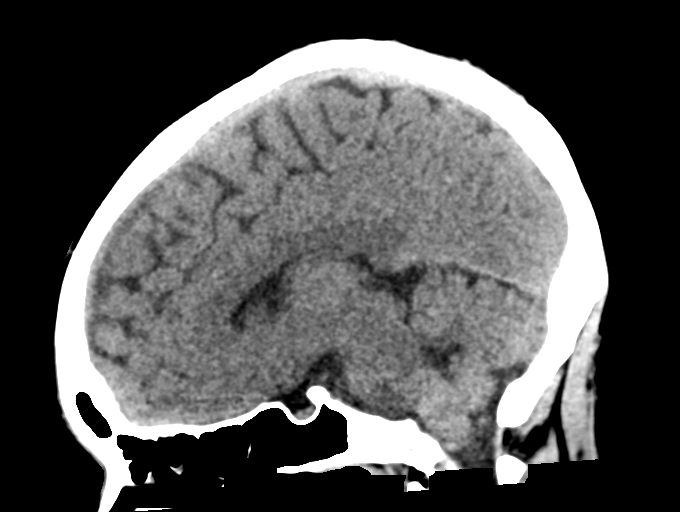
[im 45/67  brain]
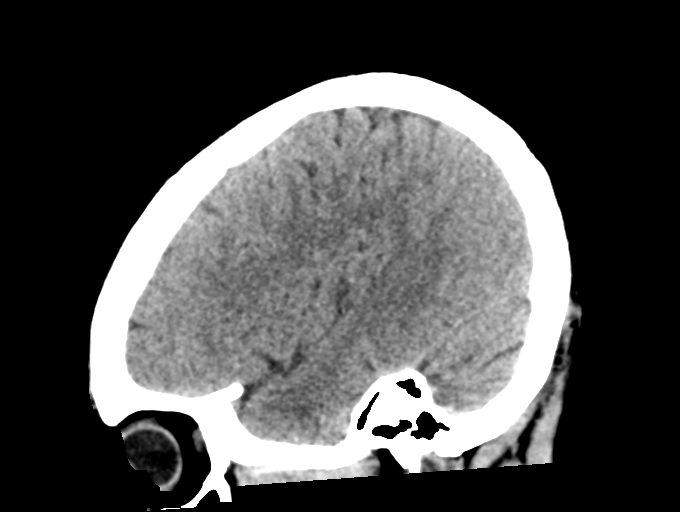

[16 of 47 positions shown; findings below may reference images not displayed]

FINDINGS: Brain: No evidence of acute infarction, hemorrhage, hydrocephalus,
extra-axial collection or mass lesion/mass effect.

Vascular: No hyperdense vessel or unexpected calcification.

Skull: Normal. Negative for fracture or focal lesion.

Sinuses/Orbits: No acute finding.

Other: None.
IMPRESSION: Normal exam.

## 2021-08-17 ENCOUNTER — Ambulatory Visit: Payer: Medicaid Other | Admitting: "Endocrinology

## 2022-05-04 ENCOUNTER — Ambulatory Visit: Payer: Medicaid Other | Admitting: "Endocrinology
